# Patient Record
Sex: Female | Born: 1985 | Race: Black or African American | Hispanic: No | Marital: Single
Health system: Southern US, Community
[De-identification: ages and names within clinical notes are randomized; demographics above are authoritative.]

---

## 2014-01-04 ENCOUNTER — Other Ambulatory Visit: Payer: Self-pay | Admitting: Infectious Disease

## 2014-01-04 ENCOUNTER — Ambulatory Visit
Admission: RE | Admit: 2014-01-04 | Discharge: 2014-01-04 | Disposition: A | Discharge status: Home / Self Care | Payer: No Typology Code available for payment source | Source: Ambulatory Visit | Attending: Infectious Disease | Admitting: Infectious Disease

## 2014-01-04 DIAGNOSIS — R7612 Nonspecific reaction to cell mediated immunity measurement of gamma interferon antigen response without active tuberculosis: Secondary | ICD-10-CM

## 2015-03-31 IMAGING — CR DG CHEST 1V
1 series · 1 of 1 positions shown · non-contrast
Comparison: None.

CLINICAL DATA: Asymptomatic.  Positive TB test.

EXAM:
CHEST - 1 VIEW

[w chest pa]
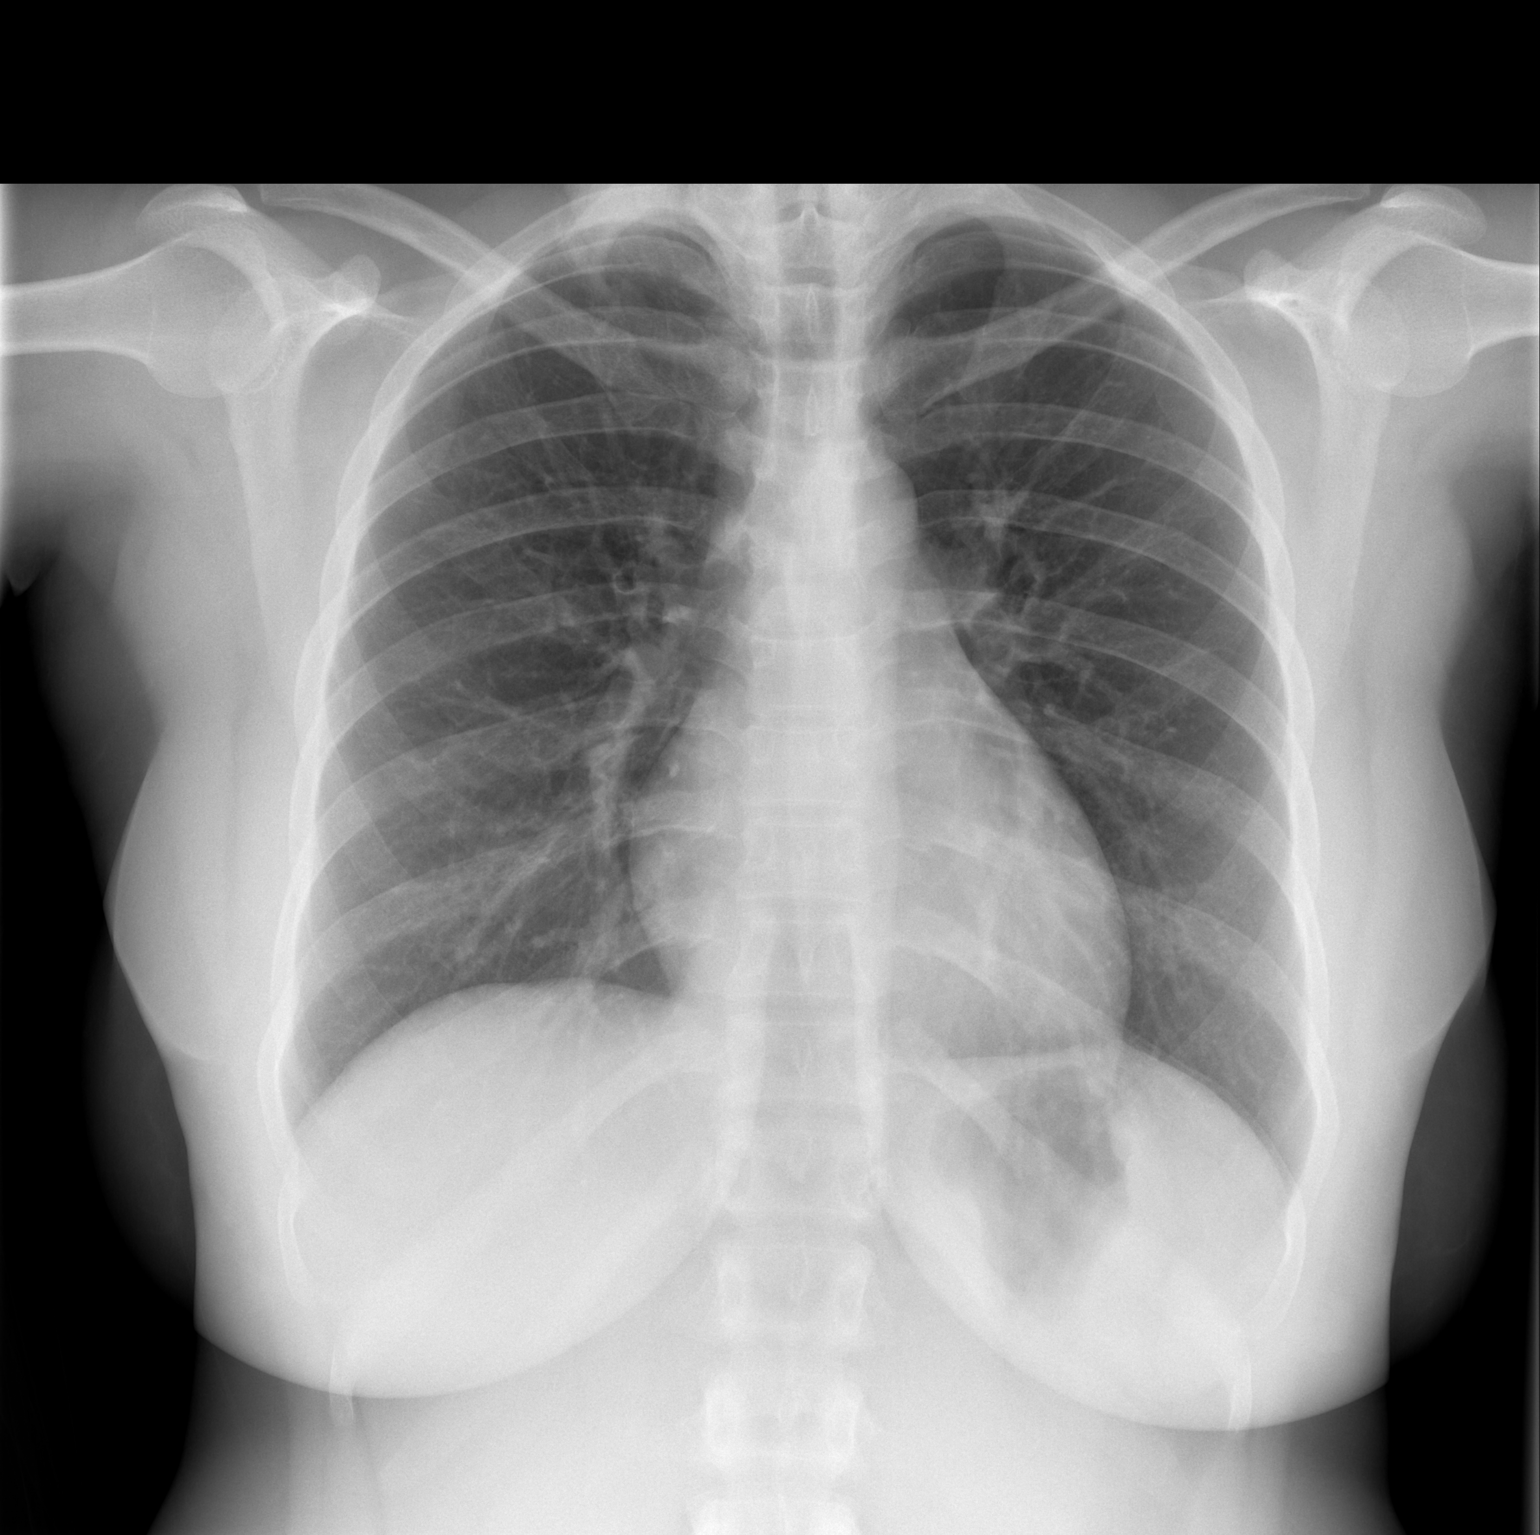

[1 of 1 positions shown; findings below may reference images not displayed]

FINDINGS: The heart size and mediastinal contours are within normal limits.
There is no focal infiltrate, pulmonary edema, or pleural effusion.
The visualized skeletal structures are unremarkable.
IMPRESSION: No active disease.
